# Patient Record
Sex: Female | Born: 1960 | Race: White | Hispanic: No | Marital: Single
Health system: Southern US, Community
[De-identification: ages and names within clinical notes are randomized; demographics above are authoritative.]

---

## 2013-06-04 ENCOUNTER — Observation Stay: Payer: Self-pay | Admitting: Family Medicine

## 2013-06-04 LAB — CBC
HCT: 40.2 % (ref 35.0–47.0)
HGB: 13.4 g/dL (ref 12.0–16.0)
MCH: 33.6 pg (ref 26.0–34.0)
MCHC: 33.5 g/dL (ref 32.0–36.0)
MCV: 100 fL (ref 80–100)
Platelet: 175 10*3/uL (ref 150–440)
RBC: 4.01 10*6/uL (ref 3.80–5.20)
RDW: 13.5 % (ref 11.5–14.5)
WBC: 6 10*3/uL (ref 3.6–11.0)

## 2013-06-04 LAB — URINALYSIS, COMPLETE
Bacteria: NONE SEEN
Bilirubin,UR: NEGATIVE
Glucose,UR: NEGATIVE mg/dL (ref 0–75)
KETONE: NEGATIVE
Leukocyte Esterase: NEGATIVE
NITRITE: NEGATIVE
Ph: 7 (ref 4.5–8.0)
Protein: NEGATIVE
Specific Gravity: 1.019 (ref 1.003–1.030)
Squamous Epithelial: 1
WBC UR: 3 /HPF (ref 0–5)

## 2013-06-04 LAB — COMPREHENSIVE METABOLIC PANEL
Albumin: 3.2 g/dL — ABNORMAL LOW (ref 3.4–5.0)
Alkaline Phosphatase: 81 U/L
Anion Gap: 6 — ABNORMAL LOW (ref 7–16)
BUN: 16 mg/dL (ref 7–18)
Bilirubin,Total: 0.3 mg/dL (ref 0.2–1.0)
Calcium, Total: 8.8 mg/dL (ref 8.5–10.1)
Chloride: 107 mmol/L (ref 98–107)
Co2: 25 mmol/L (ref 21–32)
Creatinine: 1.17 mg/dL (ref 0.60–1.30)
EGFR (African American): >60
EGFR (Non-African Amer.): 54 — ABNORMAL LOW
Glucose: 99 mg/dL (ref 65–99)
Osmolality: 277 (ref 275–301)
Potassium: 4.3 mmol/L (ref 3.5–5.1)
SGOT(AST): 34 U/L (ref 15–37)
SGPT (ALT): 24 U/L (ref 12–78)
Sodium: 138 mmol/L (ref 136–145)
Total Protein: 6.8 g/dL (ref 6.4–8.2)

## 2013-06-04 LAB — PROTIME-INR
INR: 1
Prothrombin Time: 13.2 secs (ref 11.5–14.7)

## 2013-06-04 LAB — TROPONIN I: Troponin-I: <0.02 ng/mL

## 2013-06-04 LAB — LIPID PANEL
CHOLESTEROL: 164 mg/dL (ref 0–200)
HDL Cholesterol: 37 mg/dL — ABNORMAL LOW (ref 40–60)
Ldl Cholesterol, Calc: 104 mg/dL — ABNORMAL HIGH (ref 0–100)
Triglycerides: 114 mg/dL (ref 0–200)
VLDL Cholesterol, Calc: 23 mg/dL (ref 5–40)

## 2013-06-04 LAB — CK TOTAL AND CKMB (NOT AT ARMC)
CK, Total: 50 U/L (ref 21–215)
CK-MB: 0.6 ng/mL (ref 0.5–3.6)

## 2013-06-04 LAB — RAPID INFLUENZA A&B ANTIGENS

## 2013-06-05 LAB — LIPID PANEL
CHOLESTEROL: 152 mg/dL (ref 0–200)
HDL: 32 mg/dL — AB (ref 40–60)
Ldl Cholesterol, Calc: 102 mg/dL — ABNORMAL HIGH (ref 0–100)
TRIGLYCERIDES: 90 mg/dL (ref 0–200)
VLDL CHOLESTEROL, CALC: 18 mg/dL (ref 5–40)

## 2014-01-22 ENCOUNTER — Emergency Department: Payer: Self-pay | Admitting: Emergency Medicine

## 2014-01-22 LAB — COMPREHENSIVE METABOLIC PANEL
ALK PHOS: 94 U/L
ALT: 23 U/L
AST: 29 U/L (ref 15–37)
Albumin: 3.2 g/dL — ABNORMAL LOW (ref 3.4–5.0)
Anion Gap: 6 — ABNORMAL LOW (ref 7–16)
BILIRUBIN TOTAL: 0.2 mg/dL (ref 0.2–1.0)
BUN: 20 mg/dL — ABNORMAL HIGH (ref 7–18)
CHLORIDE: 107 mmol/L (ref 98–107)
CO2: 26 mmol/L (ref 21–32)
Calcium, Total: 9.1 mg/dL (ref 8.5–10.1)
Creatinine: 1.05 mg/dL (ref 0.60–1.30)
Glucose: 101 mg/dL — ABNORMAL HIGH (ref 65–99)
OSMOLALITY: 280 (ref 275–301)
POTASSIUM: 4.5 mmol/L (ref 3.5–5.1)
Sodium: 139 mmol/L (ref 136–145)
Total Protein: 7.5 g/dL (ref 6.4–8.2)

## 2014-01-22 LAB — URINALYSIS, COMPLETE
Bacteria: NONE SEEN
Bilirubin,UR: NEGATIVE
Blood: NEGATIVE
GLUCOSE, UR: NEGATIVE mg/dL (ref 0–75)
KETONE: NEGATIVE
Nitrite: NEGATIVE
PROTEIN: NEGATIVE
Ph: 5 (ref 4.5–8.0)
RBC, UR: NONE SEEN /HPF (ref 0–5)
Specific Gravity: 1.029 (ref 1.003–1.030)
Squamous Epithelial: 10

## 2014-01-22 LAB — CBC WITH DIFFERENTIAL/PLATELET
Basophil #: 0 10*3/uL (ref 0.0–0.1)
Basophil %: 0.8 %
EOS ABS: 0.2 10*3/uL (ref 0.0–0.7)
Eosinophil %: 3.2 %
HCT: 41.4 % (ref 35.0–47.0)
HGB: 13.4 g/dL (ref 12.0–16.0)
LYMPHS PCT: 55.2 %
Lymphocyte #: 3.1 10*3/uL (ref 1.0–3.6)
MCH: 32.2 pg (ref 26.0–34.0)
MCHC: 32.4 g/dL (ref 32.0–36.0)
MCV: 100 fL (ref 80–100)
Monocyte #: 0.5 x10 3/mm (ref 0.2–0.9)
Monocyte %: 8.4 %
Neutrophil #: 1.8 10*3/uL (ref 1.4–6.5)
Neutrophil %: 32.4 %
Platelet: 227 10*3/uL (ref 150–440)
RBC: 4.16 10*6/uL (ref 3.80–5.20)
RDW: 13 % (ref 11.5–14.5)
WBC: 5.6 10*3/uL (ref 3.6–11.0)

## 2014-01-22 LAB — LIPASE, BLOOD: LIPASE: 132 U/L (ref 73–393)

## 2014-05-13 IMAGING — CT CT HEAD WITHOUT CONTRAST
1 of 3 series · 14 of 30 positions shown, 18 images · non-contrast
Comparison: None.

CLINICAL DATA: Left facial numbness and body tingling. Slurred
speech.

EXAM:
CT HEAD WITHOUT CONTRAST
TECHNIQUE: Contiguous axial images were obtained from the base of the skull
through the vertex without intravenous contrast.

[Series 2: head wo · axial · 0.40mm/px · z∈[+488,+605]mm · 14 of 32 slices shown, 18 images]
[im 3/32  brain]
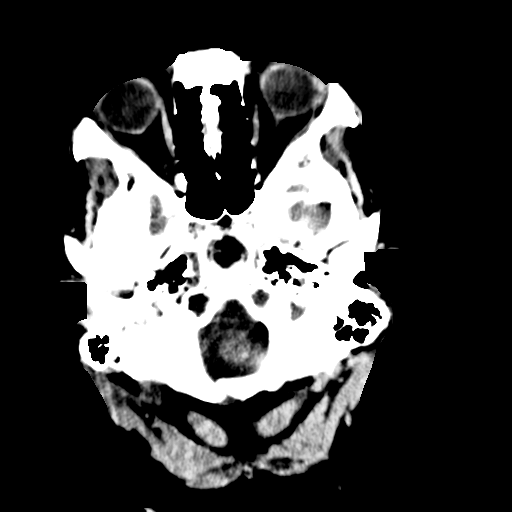
[im 3/32  bone]
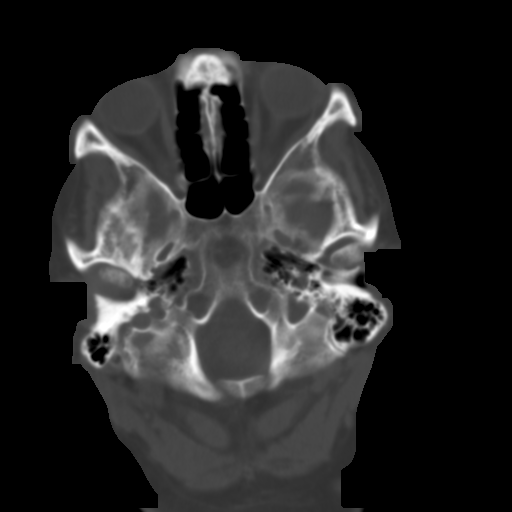
[im 5/32  brain]
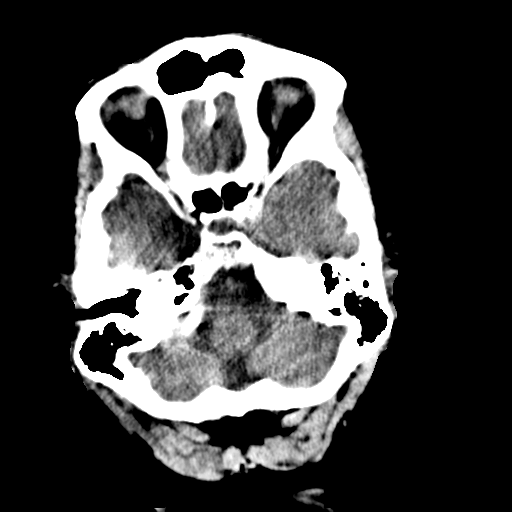
[im 7/32  brain]
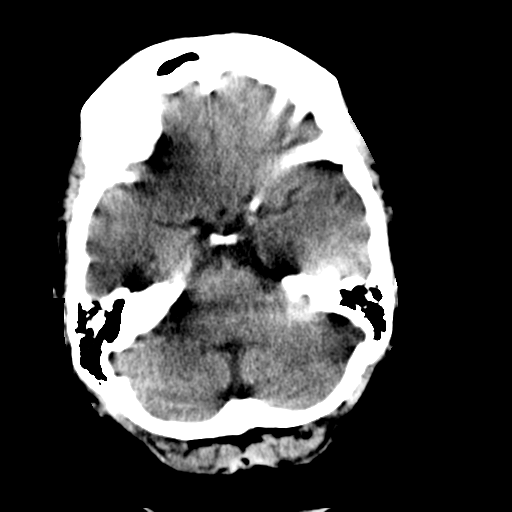
[im 9/32  brain]
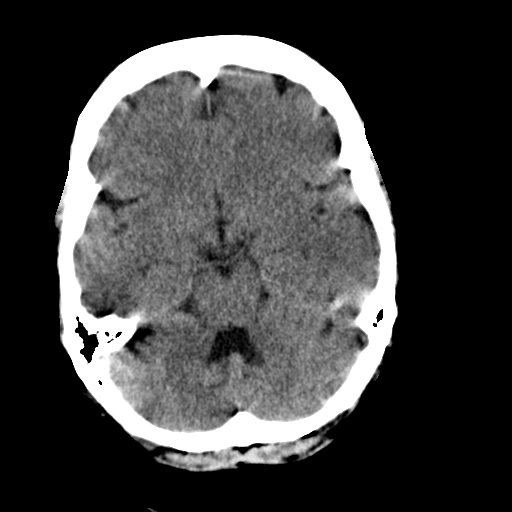
[im 11/32  brain]
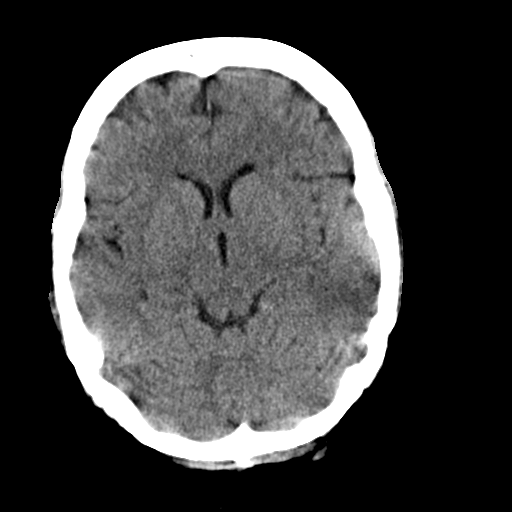
[im 11/32  bone]
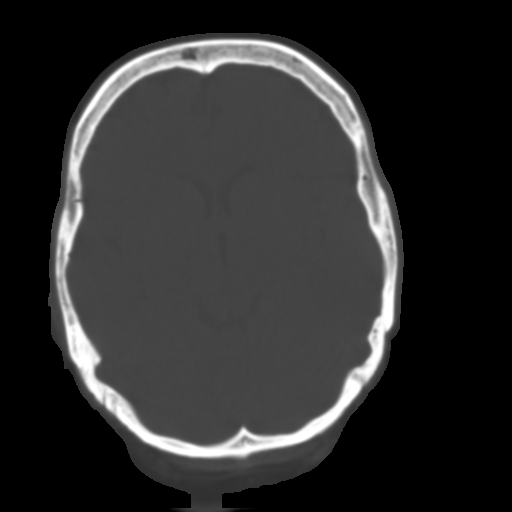
[im 13/32  brain]
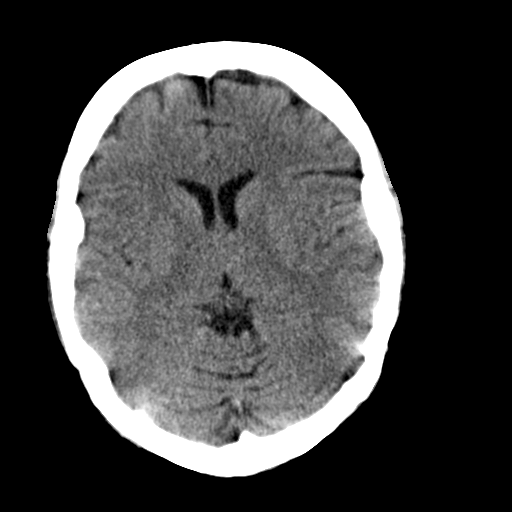
[im 15/32  brain]
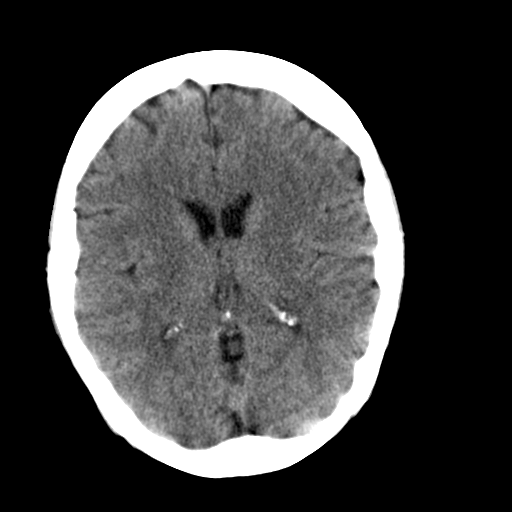
[im 17/32  brain]
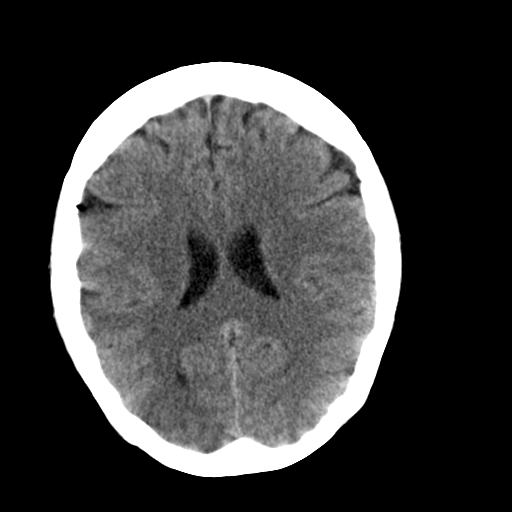
[im 19/32  brain]
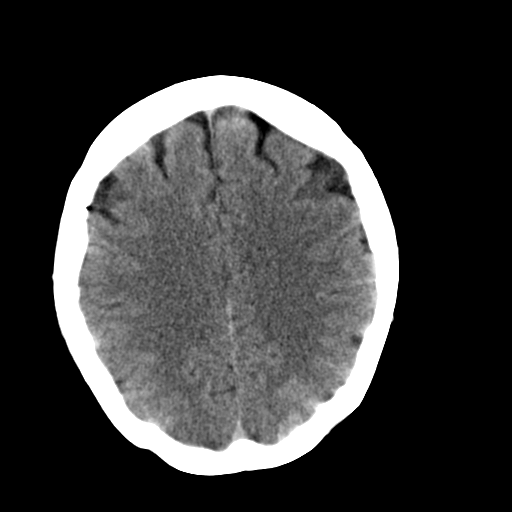
[im 19/32  bone]
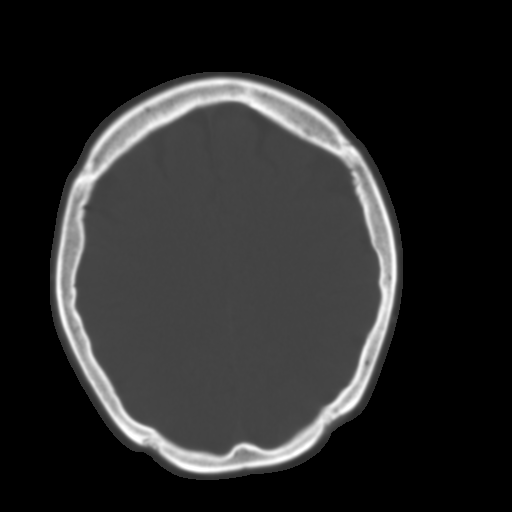
[im 21/32  brain]
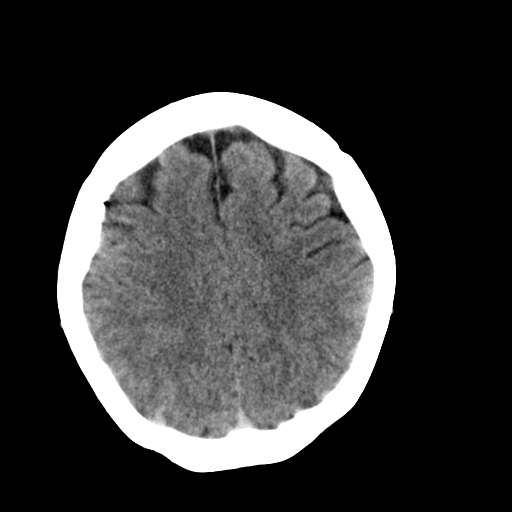
[im 23/32  brain]
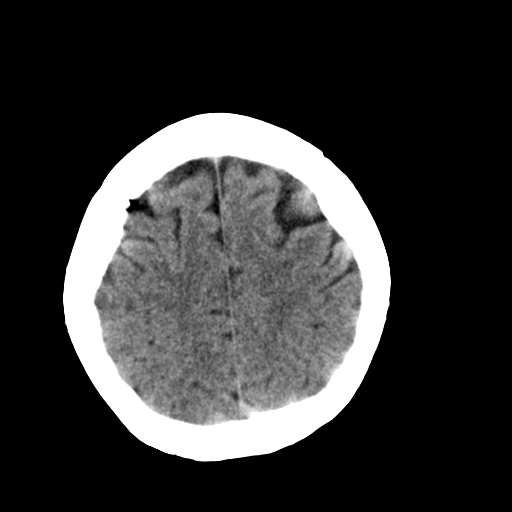
[im 25/32  brain]
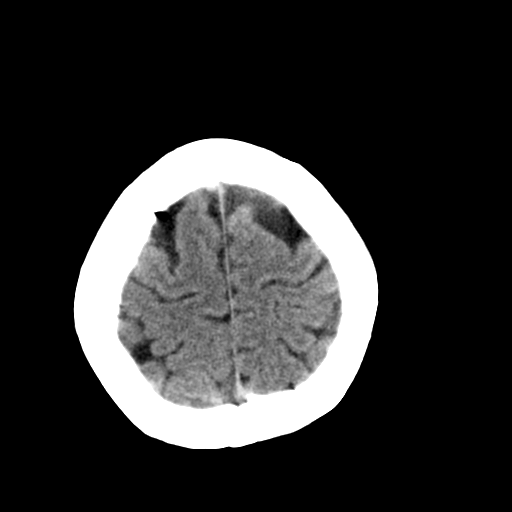
[im 27/32  brain]
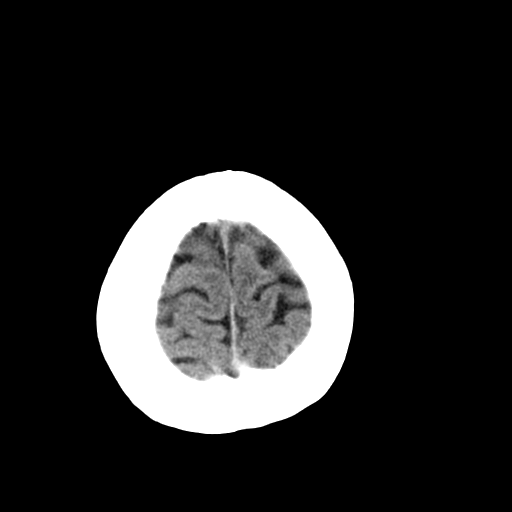
[im 27/32  bone]
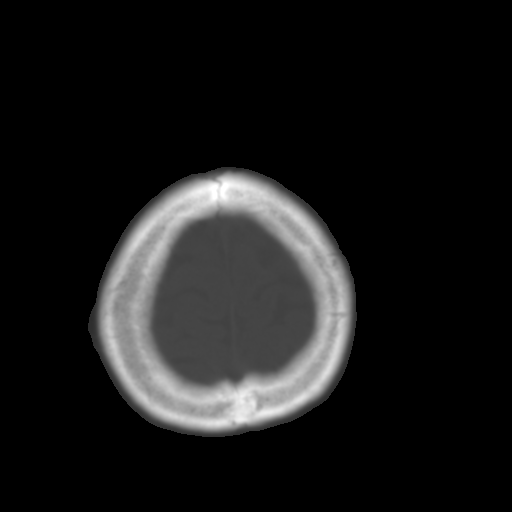
[im 29/32  brain]
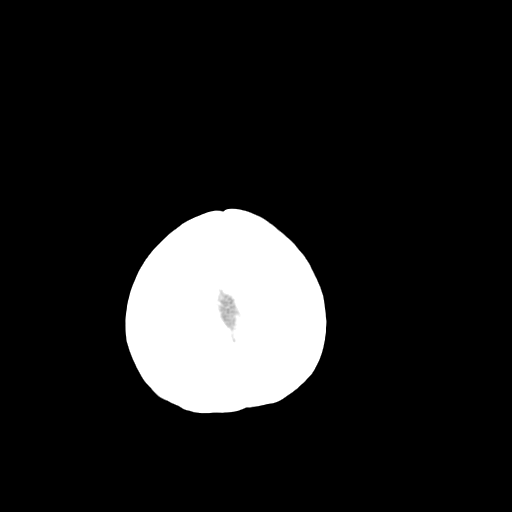

[14 of 30 positions shown; findings below may reference images not displayed]

FINDINGS: There is no evidence of acute infarction, mass lesion, or intra- or
extra-axial hemorrhage on CT.

The posterior fossa, including the cerebellum, brainstem and fourth
ventricle, is within normal limits. The third and lateral
ventricles, and basal ganglia are unremarkable in appearance. The
cerebral hemispheres are symmetric in appearance, with normal
gray-white differentiation. No mass effect or midline shift is seen.

There is no evidence of fracture; visualized osseous structures are
unremarkable in appearance. The visualized portions of the orbits
are within normal limits. The paranasal sinuses and mastoid air
cells are well-aerated. No significant soft tissue abnormalities are
seen.
IMPRESSION: Unremarkable noncontrast CT of the head.

## 2014-09-07 NOTE — H&P (Signed)
PATIENT NAME:  Misty Perez, Misty Perez MR#:  [id number] DATE OF BIRTH:  1960/09/02  DATE OF ADMISSION:  06/04/2013  REFERRING PHYSICIAN: Dr. Zenda Alpers.   PRIMARY CARE PHYSICIAN: None; however, she is scheduled to see Dr. Marcello Fennel for the first visit in March of this year.   CHIEF COMPLAINT: Left face numbness.  HISTORY OF PRESENT ILLNESS: A 54 year old Caucasian female with history of hypertension and diabetes, on no medications, presenting with left face numbness. She describes acute onset left face numbness involving her entire face lasting 4 to 5 hours prior to arrival to the Emergency Department; however, upon EMS arrival symptoms were resolved. She denied any associated slurred speech, confusion, or headache. However, she mentioned left upper extremity paresthesias without weakness. In the Emergency Department, she was evaluated by telemedicine neurology who had originally discussed TPA as she was within the window. The patient declined TPA and will be admitted for TIA work-up.   REVIEW OF SYSTEMS:  CONSTITUTIONAL: Denies fever, fatigue, weakness.  EYES: Denied blurred vision, double vision, eye pain.  ENT: Denies tinnitus, ear pain, hearing loss. RESPIRATORY: Denies cough, wheeze, shortness.  CARDIOVASCULAR: Denies chest pain, palpitations or edema. GASTROINTESTINAL: Denies nausea, vomiting, diarrhea or abdominal pain. GENITOURINARY: Denies dysuria, hematuria.  ENDOCRINE: Denies nocturia or thyroid problems.  HEMATOLOGIC AND LYMPHATIC: Denies easy bruising or bleeding.  SKIN: Denies rash or lesions.  MUSCULOSKELETAL: Positive for lower back pain, which is chronic. Denies pain in neck, shoulder, knees, hips, or arthritic symptoms.  NEUROLOGIC: Positive for numbness and weakness, as described above. Denies any dysarthria, tremor, vertigo, ataxia, headache, seizures loss, or memory loss.  PSYCHIATRIC: Denies anxiety or depressive symptoms. Otherwise, full review of systems performed by me is negative.    PAST MEDICAL HISTORY: Hypertension and diabetes; on no medications for these, as well as history of anxiety.   SOCIAL HISTORY: Denies alcohol, tobacco, or drug usage.   FAMILY HISTORY: Positive for hypertension and diabetes; however, denies any known strokes or neurological disorders.   ALLERGIES: MORPHINE, NOVOCAIN, PENICILLIN.   HOME MEDICATIONS: Include Celexa 40 mg p.o. daily, Mirapex 0.25 mg once daily at bedtime,  PHYSICAL EXAMINATION: VITAL SIGNS: Temperature 97.7, heart rate 61, respirations 18, blood pressure 197/84, saturating 99% on room air. Weight 167.8 kg, BMI 61.6.  GENERAL: Obese, Caucasian female currently in no acute distress however does appear anxious.  HEAD: Normocephalic, atraumatic.  EYES: Pupils equal round, and reactive to light. Extraocular movements intact. No scleral icterus.  MOUTH: Moist mucous membranes. Dentition absent. No abscess noted.  EARS, NOSE AND THROAT: Throat clear without exudates. No external lesions. NECK: Supple. No thyromegaly. No nodules. No JVD.  PULMONARY: Clear to auscultation bilaterally. No wheezes, rubs, or rhonchi, however diminished breath sounds secondary to body habitus. No use of accessory muscles. Good respiratory effort. CHEST: Nontender to palpation. CARDIOVASCULAR: S1 and S2 regular rate and rhythm. No murmurs, rubs, or gallops. She has lymphedema in bilateral lower extremities, worse on right than the left. Pedal pulses 2+ bilaterally.  ABDOMEN: Soft, nontender, and nondistended. No masses. Positive bowel sounds. Obese. No hepatosplenomegaly.  MUSCULOSKELETAL: No swelling, clubbing. Positive for edema as described above. Range of motion full in all extremities.  NEUROLOGIC: She has subjective numbness over the left trigeminal nerve in all 3 divisions, however, there is sparing of the bridge of the nose. She also has transient left-sided facial droop when asked to smile, although it is intermittently present during the exam. On  repeat examination, sensation intact. Reflexes intact. Pronator drift within normal limits. Strength  5/5 in all extremities including proximal and distal flexor and extensor.  SKIN: No ulcerations, rashes, or cyanosis. Warm and dry. Turgor intact.  PSYCHIATRIC: Mood and affect anxious. Awake, alert, and oriented x3. Insight and judgment intact.  LABORATORY AND DIAGNOSTICS: Sodium 138, potassium 4.3, chloride 107, bicarb 25, BUN 16, creatinine 1.17, glucose 99. WBC 6, hemoglobin 13.4, platelets 155. INR 1.   EKG: Normal sinus rhythm.   CT head: No acute intracranial process.  Chest x-ray performed revealing mild to moderate cardiomegaly, however, no acute cardiopulmonary process.   ASSESSMENT AND PLAN: A 54 year old Caucasian female with history of hypertension and  diabetes who is on no medications presenting with acute onset of left facial numbness. 1.  Transient ischemic attack. The patient denied TPA after discussion with telemedicine neurology. She has been given aspirin thus far. We will continue aspirin, start a statin therapy. Neuro checks q. 4 hours. Check MRI, lipid panel, Carotid Doppler, and transthoracic echocardiogram for risk factor modification.  2.  Hypertension. Will allow permissive hypertension without treating unless systolic greater than 220 or diastolic greater than 120. If so treat her with hydralazine 10 mg IV every 4 hours as needed for systolic greater than 70 and above or if she becomes symptomatic. 3.  Venous thromboembolism prophylaxis with sequential compression devices.  VTE  CODE STATUS: FULL code.   TIME SPENT: 45 minutes.  ____________________________ Cletis Athensavid K. Eloise Picone, MD dkh:sb D: 06/04/2013 03:27:27 ET T: 06/04/2013 10:01:58 ET JOB#: [id number]  cc: Cletis Athensavid K. Onnie Hatchel, MD, <Dictator> Azaya Goedde Synetta ShadowK Rebbie Lauricella MD ELECTRONICALLY SIGNED 06/05/2013 1:15

## 2014-09-07 NOTE — Consult Note (Signed)
Brief Consult Note: Diagnosis: Major depressive disorder recurrent moderate.   Patient was seen by consultant.   Consult note dictated.   Recommend further assessment or treatment.   Orders entered.   Comments: [name] has a h/o depression. She has been on Celexa but feels that it stopped working. She is not interetsted in any other antidepressant as they "do nothing for her". She complains of severe insomnia. She denies feeling suicidal or homicidal.   PLAN: 1. The patient does not meet criteria for IVC. Please discharge as appropriate.  2. Will start Restoril for sleep tonight.   3. She would benefit from therapy but haas no transportation. I will check onavailability of accepting providers in [place].  4. i will follow along.  Electronic Signatures: Kristine LineaPucilowska, Jolanta (MD)  (Signed 19-Jan-15 14:14)  Authored: Brief Consult Note   Last Updated: 19-Jan-15 14:14 by Kristine LineaPucilowska, Jolanta (MD)

## 2014-09-07 NOTE — Consult Note (Signed)
Referring Physician:  Lytle Butte   Primary Care Physician:  [facility], [address], [place], [place] [address], [place] [phone]  Reason for Consult: Admit Date: 04-Jun-2013  Chief Complaint: TIA   left side weakness  Reason for Consult: TIA   History of Present Illness: History of Present Illness:   54 year old woman with a history of HTN and diabetes type II presents with left face and arm numbness.  Lasted a few hours then resolved.  However, symptoms later returned very similar to the initial symptoms.  Patient was initially thought to be a candidate for tPA but she declined.  At present patient says she still has left face weakness, numbness and left arm weakness and numbness.  Left leg is normal.  Right side is normal.  Never had symptoms like this before.  Symptoms are mild to moderate in severity she says.  Nothing makes them better or worse.  No pain.   MEDICAL HISTORY: and diabetes - not on medications  SURGICAL HISTORY: HISTORY: alcohol, tobacco, or drug usage.  HISTORY: for hypertension and diabetes.neurologic conditions or diagnoses.  HOME MEDICATIONS:40 mg p.o. daily, 0.25 mg once daily at bedtime,   ALLERGIES:  MORPHINE,    ROS:  General denies complaints   HEENT no complaints   Lungs no complaints   Cardiac no complaints   GI no complaints   GU no complaints   Musculoskeletal no complaints   Extremities no complaints   Skin no complaints   Endocrine no complaints   Psych no complaints   Past Medical/Surgical Hx:  chronic back pain:   anxiety:   Home Medications: Medication Instructions Last Modified Date/Time  CeleXA 40 mg oral tablet 1 tab(s) orally once a day 18-Jan-15 22:56  Mirapex 0.25 mg oral tablet  orally once a day (at bedtime) 18-Jan-15 22:56  thiadiazine 4 milligram(s)  3 times a day 18-Jan-15 22:56   [facility] Current Meds:   Citalopram tablet, ( CeleXA)  40 mg Oral daily  - Indication:  Depression  Pramipexole tablet, ( Mirapex)  0.25 mg Oral <User Schedule> ( every 1 day: 22:00 )  Acetaminophen * tablet, ( Tylenol (325 mg) tablet)  650 mg Oral q4h PRN for pain or temp. greater than 100.4  - Indication: Pain/Fever  Aspirin Enteric Coated tablet, ( Ecotrin)  81 mg Oral daily  - Indication: Pain/Fever/Thromboembolic Disorders/Post MI/Prophylaxis MI  Instructions:  Initiate Bleeding Precautions Protocol--DO NOT CRUSH  atorvaSTATin tablet, 20 mg Oral daily  - Indication: Hypercholesterolemia  Ondansetron injection, ( Zofran injection )  4 mg, IV push, q4h PRN for Nausea/Vomiting  Indication: Nausea/ Vomiting  Nursing Saline Flush, 3 to 6 ml, IV push, Q1M PRN for IV Maintenance  Temazepam capsule,  ( Restoril)  15 mg Oral at bedtime  - Indication: Sedative/ Hypnotic/ Insomnia  Lisinopril tablet, ( Zestril)  10 mg Oral daily  - Indication: Hypertension/ CHF  Influenza Virus Quadrivalent Vaccine injection, 0.5 ml, Intramuscular, GivenOnce  Indication: provide Active Immunity to Influenza Strains contained in Vaccine, ***The patient must have a temperature of 100.5 or less, anything greater the patient needs to be afebrile x 24 hours before administration***, **Latex Free**  Allergies:  Penicillin: Hives  Novocain: Hives  Morphine: Hives  Vital Signs: **Vital Signs.:   19-Jan-15 13:20  Vital Signs Type Routine  Temperature Temperature (F) 98.1  Celsius 36.7  Temperature Source oral  Pulse Pulse 62  Respirations Respirations 20  Systolic BP Systolic  BP 846  Diastolic BP (mmHg) Diastolic BP (mmHg) 81  Mean BP 110  Pulse Ox % Pulse Ox % 94  Pulse Ox Activity Level  At rest  Oxygen Delivery Room Air/ 21 %   EXAM: GENERAL: Pleasant.  Sad.  NAD.  Normocephalic and atraumatic.  EYES: Funduscopic exam shows normal disc size, appearance and C/D ratio without clear evidence of papilledema.  CARDIOVASCULAR: S1 and S2 sounds are within normal limits, without  murmurs, gallops, or rubs.  MUSCULOSKELETAL: Bulk - Obese Tone - Normal Pronator Drift - Both hands immediately pronate, no gradual pronation raises possibility of functional or embellished symptom. Ambulation - Deferred, falls precautions.  R/L 5/5    Shoulder abduction (deltoid/supraspinatus, axillary/suprascapular n, C5) 5/5    Elbow flexion (biceps brachii, musculoskeletal n, C5-6) 5/5    Elbow extension (triceps, radial n, C7) 5/5    Finger adduction (interossei, ulnar n, T1)   5/5    Hip flexion (iliopsoas, L1/L2) 5/5    Knee flexion (hamstrings, sciatic n, L5/S1) 5/5    Knee extension (quadriceps, femoral n, L3/4) 5/5    Ankle dorsiflexion (tibialis anterior, deep fibular n, L4/5) 5/5    Ankle plantarflexion (gastroc, tibial n, S1)  NEUROLOGICAL: MENTAL STATUS: Patient is oriented to person, place and time.  Recent and remote memory are intact.  Attention span and concentration are intact.  Naming, repetition, comprehension and expressive speech are within normal limits.  Patient's fund of knowledge is within normal limits for educational level.  CRANIAL NERVES: Normal    CN II (normal visual acuity and visual fields) Normal    CN III, IV, VI (extraocular muscles are intact) Normal    CN V (facial sensation is intact bilaterally) Normal    CN VII (facial strength is intact bilaterally) Normal    CN VIII (hearing is intact bilaterally) Normal    CN IX/X (palate elevates midline, normal phonation) Normal    CN XI (shoulder shrug strength is normal and symmetric) Normal    CN XII (tongue protrudes midline)   SENSATION: Intact to pain and temp bilaterally (spinothalamic tracts) Intact to position and vibration bilaterally (dorsal columns)   REFLEXES: R/L 2+/2+    Biceps 2+/2+    Brachioradialis   2+/2+    Patellar 2+/2+    Achilles   COORDINATION/CEREBELLAR: Finger to nose testing is within normal limits..  Lab Results:  LabObservation:  19-Jan-15 10:12    OBSERVATION Reason for Test  Hepatic:  18-Jan-15 23:18   Bilirubin, Total 0.3  Alkaline Phosphatase 81 (45-117 NOTE: New Reference Range 04/06/13)  SGPT (ALT) 24  SGOT (AST) 34  Total Protein, Serum 6.8  Albumin, Serum  3.2  Routine Micro:  19-Jan-15 04:11   Micro Text Report INFLUENZA A+B ANTIGENS   COMMENT                   NEGATIVE FOR INFLUENZA A (ANTIGEN ABSENT)   COMMENT                   NEGATIVE FOR INFLUENZA B (ANTIGEN ABSENT)   ANTIBIOTIC                       Routine Chem:  18-Jan-15 23:18   Cholesterol, Serum 164  Triglycerides, Serum 114  HDL (INHOUSE)  37  VLDL Cholesterol Calculated 23  LDL Cholesterol Calculated  104 (Result(s) reported on 04 Jun 2013 at 11:43AM.)  Result Comment PT - VERIFIED IN ERROR. REORDERED  Result(s)  reported on 04 Jun 2013 at 12:05AM.  Glucose, Serum 99  BUN 16  Creatinine (comp) 1.17  Sodium, Serum 138  Potassium, Serum 4.3  Chloride, Serum 107  CO2, Serum 25  Calcium (Total), Serum 8.8  Osmolality (calc) 277  eGFR (African American) >60  eGFR (Non-African American)  54 (eGFR values <60mL/min/1.73 m2 may be an indication of chronic kidney disease (CKD). Calculated eGFR is useful in patients with stable renal function. The eGFR calculation will not be reliable in acutely ill patients when serum creatinine is changing rapidly. It is not useful in  patients on dialysis. The eGFR calculation may not be applicable to patients at the low and high extremes of body sizes, pregnant women, and vegetarians.)  Anion Gap  6  Cardiac:  18-Jan-15 23:18   CK, Total 50  CPK-MB, Serum 0.6 (Result(s) reported on 04 Jun 2013 at 12:25AM.)  Troponin I < 0.02 (0.00-0.05 0.05 ng/mL or less: NEGATIVE  Repeat testing in 3-6 hrs  if clinically indicated. >0.05 ng/mL: POTENTIAL  MYOCARDIAL INJURY. Repeat  testing in 3-6 hrs if  clinically indicated. NOTE: An increase or decrease  of 30% or more on serial  testing suggests a  clinically  important change)  Routine UA:  19-Jan-15 02:05   Color (UA) Yellow  Clarity (UA) Clear  Glucose (UA) Negative  Bilirubin (UA) Negative  Ketones (UA) Negative  Specific Gravity (UA) 1.019  Blood (UA) 3+  pH (UA) 7.0  Protein (UA) Negative  Nitrite (UA) Negative  Leukocyte Esterase (UA) Negative (Result(s) reported on 04 Jun 2013 at 03:25AM.)  RBC (UA) 71 /HPF  WBC (UA) 3 /HPF  Bacteria (UA) NONE SEEN  Epithelial Cells (UA) 1 /HPF  Mucous (UA) PRESENT  Hyaline Cast (UA) 4 /LPF (Result(s) reported on 04 Jun 2013 at 03:25AM.)  Routine Coag:  19-Jan-15 00:08   Prothrombin 13.2  INR 1.0 (INR reference interval applies to patients on anticoagulant therapy. A single INR therapeutic range for coumarins is not optimal for all indications; however, the suggested range for most indications is 2.0 - 3.0. Exceptions to the INR Reference Range may include: Prosthetic heart valves, acute myocardial infarction, prevention of myocardial infarction, and combinations of aspirin and anticoagulant. The need for a higher or lower target INR must be assessed individually. Reference: The Pharmacology and Management of the Vitamin K  antagonists: the seventh ACCP Conference on Antithrombotic and Thrombolytic Therapy. Chest.2004 Sept:126 (3suppl): 2045-2335. A HCT value >55% may artifactually increase the PT.  In one study,  the increase was an average of 25%. Reference:  "Effect on Routine and Special Coagulation Testing Values of Citrate Anticoagulant Adjustment in Patients with High HCT Values." American Journal of Clinical Pathology 2006;126:400-405.)  Routine Hem:  18-Jan-15 23:18   WBC (CBC) 6.0  RBC (CBC) 4.01  Hemoglobin (CBC) 13.4  Hematocrit (CBC) 40.2  Platelet Count (CBC) 175 (Result(s) reported on 04 Jun 2013 at 12:13AM.)  MCV 100  MCH 33.6  MCHC 33.5  RDW 13.5   Radiology Results: US:    19-Jan-15 04:04, US Carotid Doppler Bilateral  US Carotid Doppler Bilateral   REASON  FOR EXAM:    tia  COMMENTS:       PROCEDURE: US  - US CAROTID DOPPLER BILATERAL  - Jun 04 2013  4:04AM     CLINICAL DATA:  Transient ischemic attack.    EXAM:  BILATERAL CAROTID DUPLEX ULTRASOUND    TECHNIQUE:  Gray scale imaging, color Doppler and duplex ultrasound were  performed of   bilateral carotid and vertebral arteries in the neck.    COMPARISON:  CT of the head June 04, 2013  FINDINGS:  Criteria: Quantification of carotid stenosis is based on velocity  parameters that correlate the residual internal carotid diameter  with NASCET-based stenosis levels, using the diameter of the distal  internal carotid lumen as the denominator for stenosis measurement.    The following velocity measurements were obtained:    RIGHT    ICA:  81/24 cm/sec    CCA:  38/1 cm/sec    SYSTOLIC ICA/CCA RATIO:  0.8.  DIASTOLIC ICA/CCA RATIO:  2.8    ECA:  62 cm/sec    LEFT    ICA:  67/ 21 cm/sec    CCA:  82/99 cm/sec    SYSTOLIC ICA/CCA RATIO:  0.7    DIASTOLIC ICA/CCA RATIO:  1.8    ECA:  90 cm/sec  RIGHT CAROTID ARTERY: Widely patent with normal grayscale  appearance, color flow and spectral waveforms.    RIGHT VERTEBRAL ARTERY: Patent with normal antegrade color flow and  spectral waveforms.    LEFT CAROTID ARTERY: Widely patent with normal grayscale appearance,  color flow and spectral waveforms.    LEFT VERTEBRAL ARTERY: Patent with normal antegrade color flow on  spectral waveforms.     IMPRESSION:  No hemodynamically significant stenosis.  Electronically Signed    By: Elon Alas    On: 06/04/2013 04:49         Verified By: Ricky Ala, M.D.,  MRI:    19-Jan-15 09:43, MRI Brain Without Contrast  MRI Brain Without Contrast   REASON FOR EXAM:    CVA  COMMENTS:       PROCEDURE: MR  - MR BRAIN WO CONTRAST  - Jun 04 2013  9:43AM     CLINICAL DATA:  Left-sided weakness for the past day. History of  migraines. Blurry vision. Head injury over 15  years ago. No history  of brain surgery.    EXAM:  MRI HEAD WITHOUT CONTRAST    TECHNIQUE:  Multiplanar, multiecho pulse sequences of the brain and surrounding  structures were obtained without intravenous contrast.  COMPARISON:  06/04/2012 head CT.  No comparison brain MR.    FINDINGS:  No acute infarct.    No intracranial hemorrhage.    No intracranial mass lesion noted on this unenhanced exam.    No hydrocephalus.    Punctate nonspecific white matter hyperintensity left frontal lobe  (can be seen with migraine headaches).    Cervical medullary junction, pituitary region, pineal region and  orbital structures unremarkable.    Major intracranial vascular structures are patent with dominant  right vertebral artery.     IMPRESSION:  Essentially negative unenhanced MR brain asnoted above.      Electronically Signed    By: Chauncey Cruel M.D.    On: 06/04/2013 10:20         Verified By: Doug Sou, M.D.,  CT:    19-Jan-15 00:30, CT Head Without Contrast  CT Head Without Contrast   REASON FOR EXAM:    left facial numbness, and slurred speech eval  COMMENTS:       PROCEDURE: CT  - CT HEAD WITHOUT CONTRAST  - Jun 04 2013 12:30AM     CLINICAL DATA:  Left facial numbness and body tingling. Slurred  speech.    EXAM:  CT HEAD WITHOUTCONTRAST    TECHNIQUE:  Contiguous axial images were obtained from the base of the skull  through the vertex without intravenous contrast.  COMPARISON:  None.    FINDINGS:  There is no evidence of acute infarction, mass lesion, or intra- or  extra-axial hemorrhage on CT.    The posterior fossa, including the cerebellum, brainstem and fourth  ventricle, is within normal limits. The third and lateral  ventricles, and basal ganglia are unremarkable in appearance. The  cerebral hemispheres are symmetric in appearance, with normal  gray-white differentiation. No mass effect or midline shift is seen.    There is no evidence of  fracture; visualized osseous structures are  unremarkable in appearance. The visualized portions of the orbits  are within normal limits. The paranasal sinuses and mastoid air  cells are well-aerated. No significant soft tissue abnormalities are  seen.     IMPRESSION:  Unremarkable noncontrast CT of the head.      Electronically Signed    By: Jeffery  Chang M.D.    On: 06/04/2013 00:42         Verified By: JEFFREY . CHANG, M.D.,   Impression/Recommendations: Recommendations:   54 year old woman with a history of HTN and diabetes type II presents with left face and arm numbness.   are no definitively abnormal symptoms on her current exam though she continues to describe left face and arm paresthesias and weakness.  Brain MRI is negative for stroke.  As such, it is possible patient may have had a TIA initially and now is embellishing the symptoms.  No neck pain to explain left face and arm symptoms.  Would work up as TIA, needs to be on baby ASA 81 mg daily for secondary prevention. she work with his PCP to control her blood sugars and blood pressure. she work with her psychiatrist to control her depression.  - Brain MRI/A - negative - Carotid doppler - negative for carotid stenosisTTE with bubble study to evaluate for intracardiac shunts or cardiac thrombiMonitor on telemetry to eval for afibCheck fasting lipid panel, TSH and HgbA1cCycle cardiac enzymes Cardiac monitoring as described above.Antiplatelet therapy: aspirin 81 mg PO daily TREATMENT:Per ATP III guidlines, given that patient has additional stroke risk factors, start or increase statin if LDL > 70 PRESSURE CONTROL:During the first several hours after stroke, control for factors such as anxiety/stress/pain. When these factors are controlled, if blood pressure remains higher than 220/110, treat with a short acting agent (IV labetalol)Allow permissive HTN for BP less than 220/110Restart home antihypertensive medications prior to  discharge. PT, OT, Speech evaluations are not indicatedFall PrecautionsCounseled against smoking. DVT prophylaxis with [place] lovenox.  have reviewed the results of the most recent imaging studies, tests and labs as outlined above and answered all related questions. have personally viewed the patient's Brain MRI and it is unremarkable. and coordinated plan of care with hospitalist.   Electronic Signatures: Potter, Zachary E (MD)  (Signed 20-Jan-15 00:40)  Authored: REFERRING PHYSICIAN, Primary Care Physician, Consult, History of Present Illness, Review of Systems, PAST MEDICAL/SURGICAL HISTORY, HOME MEDICATIONS, Current Medications, ALLERGIES, NURSING VITAL SIGNS, Physical Exam-, LAB RESULTS, RADIOLOGY RESULTS, Recommendations   Last Updated: 20-Jan-15 00:40 by Potter, Zachary E (MD) 

## 2014-09-07 NOTE — Discharge Summary (Signed)
PATIENT NAME:  Misty Perez, Misty Perez MR#:  [id number] DATE OF BIRTH:  18-Sep-1960  DATE OF ADMISSION:  06/04/2013 DATE OF DISCHARGE:  06/05/2013  REASON FOR ADMISSION: Transient ischemic attack symptoms.   DISCHARGE DIAGNOSES: 1.  Transient ischemic attack.  2.  Major depression.  3.  Hypertension.  4.  Hyperlipidemia.  5.  Chronic pain.   DISPOSITION: Home.   DISCHARGE MEDICATIONS: 1.  Lisinopril 10 mg daily. 2.  Celexa 40 mg daily.  3.  Atorvastatin 20 mg daily. 4.  Mirapex 0.25 mg daily. 5.  Aspirin 81 mg daily. 6.  Alprazolam 1 mg every night to go to bed, only for 7 days. 7.  Budesonide 4 mg 3 times a day.  8.  Percocet 10/325 mg every 6 hours as needed for pain.   DISCHARGE INSTRUCTIONS: Follow up with Dr. Malvin JohnsPotter in the next 4 to 5 weeks. Follow up also with pain management and psychiatry outpatient referrals done.   HOSPITAL COURSE: This is a nice 54 year old female who was admitted on January 19th with a history of left face numbness. The patient presented to  the hospital waiting 4 to 5 hours in the Emergency Department from the beginning of the symptoms. She did not have slurred speech, confusion, or headache. Mentioned some left upper extremity paresthesias. The patient did not get tPA as she refused it. CT scan of the head was normal. MRI was done not showing any new lesions or strokes. The patient was admitted for evaluation of TIA. MRI of the brain, as mentioned above, showed essentially negative unenhanced MRI. Ultrasound of the carotids, Doppler, showed no significant hemodynamic stenosis. Most of her workup was unrevealing. She had a LDL of 104 for what she was started on statins as the problems continued to recur. Her LFTs were within normal. Her white count and hemoglobin were normal, 6 and 13.4 respectively. Influenza test was negative. Urinalysis showed some blood for what we recommending repeat urinalysis in the next 2 to 4 weeks with her primary care physician. The patient is  overall doing okay. She wants to schedule follow-up with Dr. Marcello FennelHande. She used to see Dr. Dareen PianoAnderson.  The patient is in good condition to be discharged. The case discussed with Dr. Marcello FennelHande.  No neurologic findings.  PHYSICAL EXAMINATION: GENERAL: Today, she is oriented x3, in no acute distress. No respiratory distress. Strength is equal in all 4 extremities.  CARDIOVASCULAR: Regular rate and rhythm.  LUNGS: Clear.  ABDOMEN: Soft.  EXTREMITIES: No edema, cyanosis, or clubbing.   TIME SPENT: I spent about 45 minutes with this discharge. ____________________________ Felipa Furnaceoberto Sanchez Gutierrez, MD rsg:sb D: 06/05/2013 14:09:09 ET T: 06/05/2013 15:12:31 ET JOB#: [id number]  cc: Felipa Furnaceoberto Sanchez Gutierrez, MD, <Dictator> Milarose Savich Juanda ChanceSANCHEZ GUTIERRE MD ELECTRONICALLY SIGNED 06/10/2013 8:26

## 2014-09-07 NOTE — Consult Note (Signed)
Brief Consult Note: Diagnosis: Major depressive disorder recurrent moderate.   Patient was seen by consultant.   Consult note dictated.   Recommend further assessment or treatment.   Orders entered.   Comments: [name] has a h/o depression. She has been on Celexa but feels that it stopped working. She is not interetsted in any other antidepressant as they "do nothing for her". She complains of severe insomnia. She denies feeling suicidal or homicidal.   She did not sleep last night with restoril. remembers that Xanax 1 mg was helpful.  PLAN: 1. The patient does not meet criteria for IVC. Please discharge as appropriate.  2. Will d/c Restoril and start Xanax 1 mg for sleep tonight.   3. She would benefit from therapy but haas no transportation. [place]  4. i will follow along.  Electronic Signatures: Kristine LineaPucilowska, Syler Norcia (MD)  (Signed 20-Jan-15 11:52)  Authored: Brief Consult Note   Last Updated: 20-Jan-15 11:52 by Kristine LineaPucilowska, Garlon Tuggle (MD)
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
## 2017-02-14 DEATH — deceased
